# Patient Record
Sex: Female | Born: 1985 | Race: White | Hispanic: No | Marital: Married
Health system: Midwestern US, Community
[De-identification: ages and names within clinical notes are randomized; demographics above are authoritative.]

## PROBLEM LIST (undated history)

## (undated) DIAGNOSIS — L709 Acne, unspecified: Secondary | ICD-10-CM

## (undated) DIAGNOSIS — I10 Essential (primary) hypertension: Secondary | ICD-10-CM

## (undated) DIAGNOSIS — G43809 Other migraine, not intractable, without status migrainosus: Secondary | ICD-10-CM

## (undated) DIAGNOSIS — G43909 Migraine, unspecified, not intractable, without status migrainosus: Secondary | ICD-10-CM

## (undated) HISTORY — PX: CHOLECYSTECTOMY: SHX55

---

## 2016-09-26 DIAGNOSIS — Z6837 Body mass index (BMI) 37.0-37.9, adult: Secondary | ICD-10-CM | POA: Insufficient documentation

## 2016-09-26 DIAGNOSIS — Z8742 Personal history of other diseases of the female genital tract: Secondary | ICD-10-CM | POA: Insufficient documentation

## 2016-09-26 DIAGNOSIS — M549 Dorsalgia, unspecified: Secondary | ICD-10-CM | POA: Insufficient documentation

## 2016-09-26 DIAGNOSIS — Z9049 Acquired absence of other specified parts of digestive tract: Secondary | ICD-10-CM | POA: Insufficient documentation

## 2016-09-26 DIAGNOSIS — G8929 Other chronic pain: Secondary | ICD-10-CM | POA: Insufficient documentation

## 2017-03-29 DIAGNOSIS — J019 Acute sinusitis, unspecified: Secondary | ICD-10-CM | POA: Insufficient documentation

## 2017-06-27 DIAGNOSIS — R590 Localized enlarged lymph nodes: Secondary | ICD-10-CM | POA: Insufficient documentation

## 2020-06-04 ENCOUNTER — Telehealth: Attending: Family Medicine | Primary: Family Medicine

## 2020-06-04 ENCOUNTER — Telehealth
Admit: 2020-06-04 | Discharge: 2020-06-04 | Payer: BLUE CROSS/BLUE SHIELD | Attending: Family Medicine | Primary: Family Medicine

## 2020-06-04 DIAGNOSIS — I10 Essential (primary) hypertension: Secondary | ICD-10-CM

## 2020-06-04 NOTE — Assessment & Plan Note (Signed)
Blood pressure has been low normal lately, advised to cut lisinopril in half.  Continue to monitor blood pressure regularly.  If normal range we will continue to wean.  Will check basic lab work.

## 2020-06-04 NOTE — Assessment & Plan Note (Signed)
Overall seem to be well managed off of propranolol.  We will continue to monitor.
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
## 2020-09-06 ENCOUNTER — Encounter

## 2020-09-06 MED ORDER — PROPRANOLOL 80 MG TAB
80 mg | ORAL_TABLET | Freq: Every day | ORAL | 1 refills | Status: DC | PRN
Start: 2020-09-06 — End: 2020-09-24

## 2020-09-06 NOTE — Telephone Encounter (Signed)
Patient is requesting a Rx refill for propranolol 80 mg    [facility] - [address]

## 2020-09-06 NOTE — Telephone Encounter (Signed)
Approved.    Argie Ramming, NP

## 2020-09-24 MED ORDER — PROPRANOLOL 80 MG 24 HR SUSTAINED ACTION CAP
80 mg | ORAL_CAPSULE | Freq: Every day | ORAL | 1 refills | Status: DC
Start: 2020-09-24 — End: 2020-12-02

## 2020-09-24 NOTE — Telephone Encounter (Signed)
Changed rx to ER formulation and resent to pharmacy.  Please let pt know

## 2020-09-24 NOTE — Telephone Encounter (Signed)
-----   Message from Dallie Dad sent at 09/24/2020  8:03 AM EST -----  Subject: Message to Provider    QUESTIONS  Information for Provider? Patient states her propanol is supposed to be   extended release instead. Please call patient nd advise on sending correct   med to pharmacy.   ---------------------------------------------------------------------------  --------------  Cleotis Lema INFO  What is the best way for the office to contact you? OK to leave message on   voicemail  Preferred Call Back Phone Number? [phone]  ---------------------------------------------------------------------------  --------------  SCRIPT ANSWERS  Relationship to Patient? Self

## 2020-09-24 NOTE — Telephone Encounter (Signed)
Patient notified that her medication was changed to Propanolol ER and it was sent to the pharmacy by the provider.

## 2020-12-02 ENCOUNTER — Telehealth: Attending: Geriatric Medicine | Primary: Family Medicine

## 2020-12-02 ENCOUNTER — Telehealth
Admit: 2020-12-02 | Discharge: 2020-12-02 | Payer: PRIVATE HEALTH INSURANCE | Attending: Geriatric Medicine | Primary: Family Medicine

## 2020-12-02 DIAGNOSIS — J019 Acute sinusitis, unspecified: Secondary | ICD-10-CM

## 2020-12-02 MED ORDER — AMOXICILLIN CLAVULANATE 875 MG-125 MG TAB
875-125 mg | ORAL_TABLET | Freq: Two times a day (BID) | ORAL | 0 refills | Status: AC
Start: 2020-12-02 — End: ?

## 2020-12-02 MED ORDER — LISINOPRIL 10 MG TAB
10 mg | ORAL_TABLET | Freq: Every day | ORAL | 3 refills | Status: DC
Start: 2020-12-02 — End: 2020-12-31

## 2020-12-02 MED ORDER — PROPRANOLOL 80 MG 24 HR SUSTAINED ACTION CAP
80 mg | ORAL_CAPSULE | Freq: Every day | ORAL | 3 refills | Status: DC
Start: 2020-12-02 — End: 2020-12-31

## 2020-12-02 NOTE — Progress Notes (Signed)
Dominique Berry (DOB: 11-May-1986) is a 35 y.o. female, established patient, here for evaluation of the following chief complaint(s):   Sinus Pain       ASSESSMENT/PLAN:  1. Acute non-recurrent sinusitis, unspecified location  2. Seasonal allergic rhinitis due to pollen    Likely sinus infection, will treat with Augmentin course, continue Flonase, can use Allegra or Zyrtec or Claritin as well.  Refill patient's propranolol and lisinopril,  Patient to see office for physical in the next few months      SUBJECTIVE/OBJECTIVE:  Sinus Pain   The history is provided by the patient. This is a new problem. The current episode started more than 1 week ago. The problem has been gradually worsening. There has been no fever. Associated symptoms include congestion, sore throat and cough. She has tried decongestants and nasal steroids for the symptoms.       Review of Systems   HENT: Positive for congestion, sinus pain and sore throat.    Eyes: Negative.    Respiratory: Positive for cough.    Cardiovascular: Negative.    Gastrointestinal: Negative.    Genitourinary: Negative.    Musculoskeletal: Negative.    Neurological: Negative.    Psychiatric/Behavioral: Negative.         No flowsheet data found.    Physical Exam  Vitals reviewed.   Constitutional:       Appearance: Normal appearance.   HENT:      Head: Normocephalic.      Right Ear: External ear normal.      Left Ear: External ear normal.      Nose: Nose normal.      Mouth/Throat:      Mouth: Mucous membranes are moist.   Eyes:      Pupils: Pupils are equal, round, and reactive to light.   Pulmonary:      Effort: Pulmonary effort is normal.   Musculoskeletal:      Cervical back: Normal range of motion.   Skin:     Coloration: Skin is not jaundiced or pale.   Neurological:      General: No focal deficit present.      Mental Status: She is alert and oriented to person, place, and time.   Psychiatric:         Mood and Affect: Mood normal.         Behavior: Behavior normal.          Thought Content: Thought content normal.         Judgment: Judgment normal.                   Dimas Millin is being evaluated by a Virtual Visit (video visit) encounter to address concerns as mentioned above.  A caregiver was present when appropriate. Due to this being a Scientist, research (medical) (During COVID-19 public health emergency), evaluation of the following organ systems was limited: Vitals/Constitutional/EENT/Resp/CV/GI/GU/MS/Neuro/Skin/Heme-Lymph-Imm.  Pursuant to the emergency declaration under the [facility] Act and the [employer], 1135 waiver authority and the [employer] Act, this Virtual Visit was conducted with patient's (and/or legal guardian's) consent, to reduce the patient's risk of exposure to COVID-19 and provide necessary medical care.  The patient (and/or legal guardian) has also been advised to contact this office for worsening conditions or problems, and seek emergency medical treatment and/or call 911 if deemed necessary.    Patient identification was verified at the start of the visit: Yes    Services were provided  through a video synchronous discussion virtually to substitute for in-person clinic visit. Patient and provider were located at their individual homes.    An electronic signature was used to authenticate this note.  -- Frankey Shown, MD

## 2020-12-31 ENCOUNTER — Ambulatory Visit: Attending: Family Medicine | Primary: Family Medicine

## 2020-12-31 ENCOUNTER — Ambulatory Visit
Admit: 2020-12-31 | Discharge: 2020-12-31 | Payer: PRIVATE HEALTH INSURANCE | Attending: Family Medicine | Primary: Family Medicine

## 2020-12-31 DIAGNOSIS — Z0000 Encounter for general adult medical examination without abnormal findings: Secondary | ICD-10-CM

## 2020-12-31 MED ORDER — RIZATRIPTAN 10 MG TAB, RAPID DISSOLVE
10 mg | ORAL_TABLET | Freq: Once | ORAL | 5 refills | Status: AC | PRN
Start: 2020-12-31 — End: 2020-12-31

## 2020-12-31 MED ORDER — PROPRANOLOL 80 MG 24 HR SUSTAINED ACTION CAP
80 mg | ORAL_CAPSULE | Freq: Every day | ORAL | 3 refills | Status: AC
Start: 2020-12-31 — End: ?

## 2020-12-31 MED ORDER — LISINOPRIL 10 MG TAB
10 mg | ORAL_TABLET | Freq: Every day | ORAL | 3 refills | Status: AC
Start: 2020-12-31 — End: 2021-12-31

## 2020-12-31 NOTE — Progress Notes (Signed)
Dominique MillinHolly Berry (DOB: 06/14/1986) is a 35 y.o. female, new patient, here for evaluation of the following chief complaint(s):  Complete Physical (Grad School Paperwork)       ASSESSMENT/PLAN:  1. Physical exam  2. Migraine without status migrainosus, not intractable, unspecified migraine type  -     propranolol LA (INDERAL LA) 80 mg SR capsule; Take 1 Capsule by mouth daily., Normal, Disp-90 Capsule, R-3  -     rizatriptan (MAXALT-MLT) 10 mg disintegrating tablet; Take 1 Tablet by mouth once as needed for Migraine for up to 1 dose., Normal, Disp-30 Tablet, R-5  3. Hypertension, unspecified type  -     lisinopriL (PRINIVIL, ZESTRIL) 10 mg tablet; Take 1 Tablet by mouth daily., Normal, Disp-90 Tablet, R-3      Immunizations recorded will ensure they are up-to-date.  Up-to-date on Pap smear.  Physical exam done today, no abnormalities noted.  We will work on diet exercise as well as weight loss.  Vision corrected and hearing normal.    Patient is physically and emotionally healthy, I expect no limitations in her clinical studies.    No orders of the defined types were placed in this encounter.      SUBJECTIVE/OBJECTIVE:  HPI  35 year old female who presents for complete physical.  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  She is starting in the fall.    She has no acute complaints today, is going to work hard at losing weight, exercising and eating healthy.  Her goal is to get off her blood pressure and migraine medications.    PMH   has a past medical history of Migraines.    Past Surgical History:   Procedure Laterality Date   ??? HX CHOLECYSTECTOMY  2000       Current Outpatient Medications on File Prior to Visit   Medication Sig Dispense Refill   ??? EPINEPHrine (EPIPEN) 0.3 mg/0.3 mL injection 0.3 mg by IntraMUSCular route.     ??? fluticasone propionate (FLONASE) 50 mcg/actuation nasal spray 2 Sprays by Nasal route daily.     ??? therapeutic multivitamin (THERAGRAN) tablet Take 1 Tablet by  mouth daily.     ??? amoxicillin-clavulanate (AUGMENTIN) 875-125 mg per tablet Take 1 Tablet by mouth every twelve (12) hours. (Patient not taking: Reported on 12/31/2020) 20 Tablet 0   ??? [DISCONTINUED] lisinopriL (PRINIVIL, ZESTRIL) 10 mg tablet Take 1 Tablet by mouth daily. 90 Tablet 3   ??? [DISCONTINUED] propranolol LA (INDERAL LA) 80 mg SR capsule Take 1 Capsule by mouth daily. 90 Capsule 3   ??? norethindrone (MICRONOR) 0.35 mg tab Take 0.35 mg by mouth daily. (Patient not taking: Reported on 12/31/2020)     ??? [DISCONTINUED] rizatriptan (MAXALT-MLT) 10 mg disintegrating tablet 10 mg by SubLINGual route.       No current facility-administered medications on file prior to visit.       Social History     Socioeconomic History   ??? Marital status: MARRIED     Spouse name: Not on file   ??? Number of children: Not on file   ??? Years of education: Not on file   ??? Highest education level: Not on file   Occupational History   ??? Not on file   Tobacco Use   ??? Smoking status: Never Smoker   ??? Smokeless tobacco: Never Used   Vaping Use   ??? Vaping Use: Never used   Substance and Sexual Activity   ??? Alcohol  use: Yes     Comment: socially    ??? Drug use: Never   ??? Sexual activity: Yes     Partners: Male     Birth control/protection: Pill   Other Topics Concern   ??? Not on file   Social History Narrative   ??? Not on file     Social Determinants of Health     Financial Resource Strain:    ??? Difficulty of Paying Living Expenses: Not on file   Food Insecurity:    ??? Worried About Running Out of Food in the Last Year: Not on file   ??? Ran Out of Food in the Last Year: Not on file   Transportation Needs:    ??? Lack of Transportation (Medical): Not on file   ??? Lack of Transportation (Non-Medical): Not on file   Physical Activity:    ??? Days of Exercise per Week: Not on file   ??? Minutes of Exercise per Session: Not on file   Stress:    ??? Feeling of Stress : Not on file   Social Connections:    ??? Frequency of Communication with Friends and Family: Not  on file   ??? Frequency of Social Gatherings with Friends and Family: Not on file   ??? Attends Religious Services: Not on file   ??? Active Member of Clubs or Organizations: Not on file   ??? Attends Banker Meetings: Not on file   ??? Marital Status: Not on file   Intimate Partner Violence:    ??? Fear of Current or Ex-Partner: Not on file   ??? Emotionally Abused: Not on file   ??? Physically Abused: Not on file   ??? Sexually Abused: Not on file   Housing Stability:    ??? Unable to Pay for Housing in the Last Year: Not on file   ??? Number of Places Lived in the Last Year: Not on file   ??? Unstable Housing in the Last Year: Not on file       Family History   Adopted: Yes   Problem Relation Age of Onset   ??? Thyroid Disease Mother    ??? Diabetes Mother    ??? Cancer Mother         Colon Cancer and Breast Cancer   ??? Hypertension Maternal Grandmother    ??? Diabetes Paternal Grandmother    ??? Hypertension Paternal Grandmother    ??? Headache Paternal Grandmother    ??? Cancer Paternal Grandmother         Skin    ??? OSTEOARTHRITIS Half-sister    ??? Allergic Rhinitis Half-sister    ??? Migraines Half-sister          ALLERGIES  Allergies   Allergen Reactions   ??? Hymenoptera Allergenic Extract Anaphylaxis          ??? Stings [Sting, Bee] Anaphylaxis       PREVIOUS PRIMARY CARE PROVIDER  Terri Skains, MD      RECORDS    Provided by patient:      Review of Systems   Constitutional: Negative for activity change, appetite change, fever and unexpected weight change.   Respiratory: Negative for cough and shortness of breath.    Cardiovascular: Negative for chest pain.   Skin: Negative for rash.   Neurological: Negative for dizziness and headaches.   Psychiatric/Behavioral: Negative for sleep disturbance.       Visit Vitals  BP 130/78 (BP 1 Location: Left upper arm, BP Patient  Position: Sitting, BP Cuff Size: Adult)   Pulse 60   Temp 98.3 ??F (36.8 ??C) (Oral)   Resp 12   Ht 5\' 10"  (1.778 m)   Wt 263 lb (119.3 kg)   SpO2 98%   BMI 37.74 kg/m??        Physical Exam  Constitutional:       General: She is not in acute distress.     Appearance: Normal appearance. She is not ill-appearing, toxic-appearing or diaphoretic.   HENT:      Head: Normocephalic and atraumatic.      Right Ear: Tympanic membrane, ear canal and external ear normal. There is no impacted cerumen.      Left Ear: Tympanic membrane, ear canal and external ear normal. There is no impacted cerumen.      Nose: Nose normal. No congestion or rhinorrhea.      Mouth/Throat:      Mouth: Mucous membranes are moist.      Pharynx: Oropharynx is clear. No oropharyngeal exudate or posterior oropharyngeal erythema.   Eyes:      General: No scleral icterus.        Right eye: No discharge.         Left eye: No discharge.      Extraocular Movements: Extraocular movements intact.      Conjunctiva/sclera: Conjunctivae normal.      Pupils: Pupils are equal, round, and reactive to light.   Cardiovascular:      Rate and Rhythm: Normal rate and regular rhythm.      Heart sounds: Normal heart sounds. No murmur heard.  No friction rub. No gallop.    Pulmonary:      Effort: Pulmonary effort is normal. No respiratory distress.      Breath sounds: Normal breath sounds. No stridor. No wheezing, rhonchi or rales.   Abdominal:      General: Abdomen is flat. Bowel sounds are normal. There is no distension.      Palpations: Abdomen is soft. There is no mass.      Tenderness: There is no abdominal tenderness. There is no right CVA tenderness, left CVA tenderness, guarding or rebound.      Hernia: No hernia is present.   Musculoskeletal:         General: No swelling. Normal range of motion.      Cervical back: Normal range of motion. No rigidity or tenderness.   Lymphadenopathy:      Cervical: No cervical adenopathy.   Skin:     General: Skin is warm.      Findings: No rash.   Neurological:      Mental Status: She is alert.   Psychiatric:         Mood and Affect: Mood normal.         Behavior: Behavior normal.             No  LOS data to display            Orders Placed This Encounter   ??? lisinopriL (PRINIVIL, ZESTRIL) 10 mg tablet     Sig: Take 1 Tablet by mouth daily.     Dispense:  90 Tablet     Refill:  3   ??? propranolol LA (INDERAL LA) 80 mg SR capsule     Sig: Take 1 Capsule by mouth daily.     Dispense:  90 Capsule     Refill:  3   ??? rizatriptan (MAXALT-MLT) 10 mg disintegrating tablet  Sig: Take 1 Tablet by mouth once as needed for Migraine for up to 1 dose.     Dispense:  30 Tablet     Refill:  5        An electronic signature was used to authenticate this note.  Terri Skains, MD

## 2021-01-07 NOTE — Telephone Encounter (Signed)
-----   Message from Darla Lesches sent at 01/07/2021  9:05 AM EDT -----  Subject: Message to Provider    QUESTIONS  Information for Provider? Patient called stating she had physical   appointment 12/31/2020, physical forms that was completed needs updated   info attached to it  ---------------------------------------------------------------------------  --------------  CALL BACK INFO  What is the best way for the office to contact you? OK to leave message on   voicemail  Preferred Call Back Phone Number? [phone]  ---------------------------------------------------------------------------  --------------  SCRIPT ANSWERS  undefined

## 2021-01-07 NOTE — Telephone Encounter (Signed)
I thought it was all filled out? If there was a place missed please let me know.

## 2021-01-07 NOTE — Telephone Encounter (Signed)
I spoke to the patient through the [facility] and she stated at the bottom of the form, it asks about mental health.  This was the part that was not filled out.     Because this is not being filled out, it is not being excepted.     Do we still have this form?

## 2021-01-07 NOTE — Telephone Encounter (Signed)
Yes the very bottom where it asks if the patient is mentally capable of doing her job.

## 2021-01-11 NOTE — Telephone Encounter (Signed)
Called patient and left a message, pelase let me know when she calls back

## 2021-03-22 ENCOUNTER — Emergency Department (INDEPENDENT_AMBULATORY_CARE_PROVIDER_SITE_OTHER): Payer: Self-pay

## 2021-03-22 ENCOUNTER — Emergency Department (INDEPENDENT_AMBULATORY_CARE_PROVIDER_SITE_OTHER)
Admission: RE | Admit: 2021-03-22 | Discharge: 2021-03-22 | Disposition: A | Discharge status: Home / Self Care | Payer: Self-pay | Source: Ambulatory Visit

## 2021-03-22 ENCOUNTER — Other Ambulatory Visit: Payer: Self-pay

## 2021-03-22 VITALS — BP 111/71 | HR 70 | Temp 99.3°F | Resp 17 | Ht 69.0 in | Wt 230.0 lb

## 2021-03-22 DIAGNOSIS — S39012A Strain of muscle, fascia and tendon of lower back, initial encounter: Secondary | ICD-10-CM

## 2021-03-22 DIAGNOSIS — S161XXA Strain of muscle, fascia and tendon at neck level, initial encounter: Secondary | ICD-10-CM

## 2021-03-22 DIAGNOSIS — M542 Cervicalgia: Secondary | ICD-10-CM

## 2021-03-22 DIAGNOSIS — M25562 Pain in left knee: Secondary | ICD-10-CM

## 2021-03-22 DIAGNOSIS — M25519 Pain in unspecified shoulder: Secondary | ICD-10-CM

## 2021-03-22 MED ORDER — IBUPROFEN 400 MG PO TABS: 400.0000 mg | ORAL_TABLET | Freq: Four times a day (QID) | ORAL | 0 refills | Status: AC | PRN

## 2021-03-22 MED ORDER — BACLOFEN 10 MG PO TABS: 10.0000 mg | ORAL_TABLET | Freq: Three times a day (TID) | ORAL | 0 refills | Status: AC
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
## 2021-03-22 NOTE — Discharge Instructions (Addendum)
Advised/instructed patient take medications as directed.  Advised patient may take medication either daily for the next 5 to 7 days or as needed.  Advised/encouraged patient to increase daily water intake while taking these medications.

## 2021-04-06 ENCOUNTER — Telehealth: Admit: 2021-04-06 | Discharge: 2021-04-06 | Attending: Family Medicine | Primary: Family Medicine

## 2021-04-06 MED ORDER — TRETINOIN 0.025 % EX GEL
0.025 % | CUTANEOUS | 2 refills | Status: DC
Start: 2021-04-06 — End: 2021-04-08
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
## 2021-04-07 NOTE — Telephone Encounter (Signed)
Please call [facility] in [place] to clarify prescription

## 2021-04-07 NOTE — Telephone Encounter (Signed)
Spoke with [facility] - Clarification Complete

## 2021-04-08 MED ORDER — TRETINOIN 0.025 % EX GEL
0.025 % | CUTANEOUS | 2 refills | Status: DC
Start: 2021-04-08 — End: 2021-05-30

## 2021-04-08 NOTE — Addendum Note (Signed)
Addended by: Terri Skains on: 04/08/2021 12:03 PM     Modules accepted: Orders

## 2021-04-08 NOTE — Telephone Encounter (Signed)
Pharmacy can not fill prescription for tretinoin as written. Please send new rx and include # of days. Number of days for 45g tube is 90

## 2021-04-08 NOTE — Telephone Encounter (Signed)
Ok! resent

## 2021-05-30 ENCOUNTER — Telehealth: Admit: 2021-05-30 | Attending: Family Medicine | Primary: Family Medicine

## 2021-05-30 DIAGNOSIS — I10 Essential (primary) hypertension: Secondary | ICD-10-CM

## 2021-05-30 MED ORDER — TRETINOIN 0.025 % EX GEL
0.025 % | CUTANEOUS | 2 refills | Status: DC
Start: 2021-05-30 — End: 2021-06-17

## 2021-05-30 MED ORDER — BACLOFEN 10 MG PO TABS
10 MG | ORAL_TABLET | Freq: Every day | ORAL | 3 refills | Status: AC | PRN
Start: 2021-05-30 — End: ?
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
## 2021-06-10 ENCOUNTER — Telehealth

## 2021-06-10 NOTE — Telephone Encounter (Signed)
Coverm[employer]eds sent a PA request for the patient's Tretinoin 0.025% gel.  This as placed in Covermymeds.      Patient notified

## 2021-06-14 NOTE — Telephone Encounter (Signed)
The PA for the Tretinoin 0.025% gel was denied per [email or web address].

## 2021-06-15 NOTE — Telephone Encounter (Signed)
Please place an order for the Tretinoin 0.025% cream she will pay out of pocket for this.

## 2021-06-17 MED ORDER — TRETINOIN 0.025 % EX GEL
0.025 % | CUTANEOUS | 2 refills | Status: DC
Start: 2021-06-17 — End: 2021-07-06

## 2021-06-17 NOTE — Addendum Note (Signed)
Addended by: Terri Skains on: 06/17/2021 02:35 PM     Modules accepted: Orders

## 2021-07-05 NOTE — Telephone Encounter (Signed)
Tretinoin 0.025 Cream please send to [facility] in [place].     Med pended to [facility] in [place].

## 2021-07-05 NOTE — Telephone Encounter (Signed)
Patient would like Tretinoin Cream prescribed and refilled not the Gel, she knows of price difference and will pay out of pocket. Please send to her pharmacy [facility] in [place], [place]   thank you.Marland KitchenMarland Kitchen

## 2021-07-06 MED ORDER — TRETINOIN 0.025 % EX CREA
0.025 % | CUTANEOUS | 2 refills | Status: AC
Start: 2021-07-06 — End: ?

## 2021-12-07 ENCOUNTER — Emergency Department (INDEPENDENT_AMBULATORY_CARE_PROVIDER_SITE_OTHER)
Admission: RE | Admit: 2021-12-07 | Discharge: 2021-12-07 | Disposition: A | Discharge status: Home / Self Care | Payer: BC Managed Care – PPO | Source: Ambulatory Visit

## 2021-12-07 VITALS — BP 124/82 | HR 88 | Temp 99.3°F | Resp 18

## 2021-12-07 DIAGNOSIS — L0501 Pilonidal cyst with abscess: Secondary | ICD-10-CM | POA: Diagnosis not present

## 2021-12-07 DIAGNOSIS — J988 Other specified respiratory disorders: Secondary | ICD-10-CM

## 2021-12-07 DIAGNOSIS — B9789 Other viral agents as the cause of diseases classified elsewhere: Secondary | ICD-10-CM | POA: Diagnosis not present

## 2021-12-07 MED ORDER — SULFAMETHOXAZOLE-TRIMETHOPRIM 800-160 MG PO TABS: 1.0000 | ORAL_TABLET | Freq: Two times a day (BID) | ORAL | 0 refills | Status: AC

## 2021-12-07 NOTE — ED Notes (Signed)
Poor IV access- provider updated - warm pack applied to R AC fossa. Will attempt shortly ?

## 2021-12-07 NOTE — Discharge Instructions (Addendum)
Advised patient to take medication as directed with food to completion.  Encouraged patient increase daily water intake while taking these medications. Advised/encouraged patient to follow-up in 2 days for recheck or Friday, 12/09/2021. ?

## 2021-12-07 NOTE — ED Provider Notes (Signed)
?[facility] ? ? ? ?CSN: [id number] ?Arrival date & time: 12/07/21  1836 ? ? ?  ? ?History   ?Chief Complaint ?Chief Complaint  ?Patient presents with  ? Fever  ? Pilonidal cyst  ? ? ?HPI ?Courtney Rodriguez is a 36 y.o. female.  ? ?HPI 36 year old female presents with pilonidal cyst and fever for 4 days.  Patient is accompanied by her husband who reports states doubled in size since Saturday, red warm to touch having body aches and fever 100.1. ? ?History reviewed. No pertinent past medical history. ? ?Patient Active Problem List  ? Diagnosis Date Noted  ? Cervical lymphadenopathy 06/27/2017  ? Acute sinusitis 03/29/2017  ? BMI 37.0-37.9, adult 09/26/2016  ? Chronic back pain 09/26/2016  ? History of cholecystectomy 09/26/2016  ? History of ovarian cyst 09/26/2016  ? ? ?Past Surgical History:  ?Procedure Laterality Date  ? CHOLECYSTECTOMY    ? ? ?OB History   ?No obstetric history on file. ?  ? ? ? ?Home Medications   ? ?Prior to Admission medications   ?Medication Sig Start Date End Date Taking? Authorizing Provider  ?sulfamethoxazole-trimethoprim (BACTRIM DS) 800-160 MG tablet Take 1 tablet by mouth 2 (two) times daily for 10 days. 12/07/21 12/17/21 Yes Trevor Iha, FNP  ?albuterol (VENTOLIN HFA) 108 (90 Base) MCG/ACT inhaler Inhale into the lungs. 09/26/16   [provider]  ?baclofen (LIORESAL) 10 MG tablet Take 1 tablet (10 mg total) by mouth 3 (three) times daily. 03/22/21   Trevor Iha, FNP  ?EPINEPHrine 0.3 mg/0.3 mL IJ SOAJ injection Inject into the muscle.    [provider]  ?fluticasone (FLONASE) 50 MCG/ACT nasal spray Place into the nose. 12/30/19   [provider]  ?ibuprofen (ADVIL) 400 MG tablet Take 1 tablet (400 mg total) by mouth every 6 (six) hours as needed. 03/22/21   Trevor Iha, FNP  ?lisinopril (ZESTRIL) 10 MG tablet Take 1 tablet by mouth daily. 12/30/19 12/31/21  [provider]  ?Multiple Vitamins-Minerals (ONCOVITE) TABS Take 1 tablet by mouth daily.     [provider]  ?propranolol (INNOPRAN XL) 80 MG 24 hr capsule Take 1 capsule by mouth daily. 08/03/17   [provider]  ?rizatriptan (MAXALT-MLT) 10 MG disintegrating tablet Place under the tongue. 08/03/17   [provider]  ? ? ?Family History ?Family History  ?Adopted: Yes  ?Problem Relation Age of Onset  ? Hypertension Mother   ? Heart failure Mother   ? Diabetes Mother   ? ? ?Social History ?Social History  ? ?Tobacco Use  ? Smoking status: Never  ?  Passive exposure: Never  ? Smokeless tobacco: Never  ?Vaping Use  ? Vaping Use: Never used  ?Substance Use Topics  ? Alcohol use: Not Currently  ? Drug use: Never  ? ? ? ?Allergies   ?Bee venom and Wasp venom protein ? ? ?Review of Systems ?Review of Systems  ?Skin:   ?     Pilonidal cyst of gluteal cleft x5 days  ? ? ?Physical Exam ?Triage Vital Signs ?ED Triage Vitals  ?Enc Vitals Group  ?   BP   ?   Pulse   ?   Resp   ?   Temp   ?   Temp src   ?   SpO2   ?   Weight   ?   Height   ?   Head Circumference   ?   Peak Flow   ?   Pain Score   ?  Pain Loc   ?   Pain Edu?   ?   Excl. in [place]?   ? ?No data found. ? ?Updated Vital Signs ?BP 124/82 (BP Location: Right Arm)   Pulse 88   Temp 99.3 ?F (37.4 ?C) (Oral)   Resp 18   LMP  (LMP Unknown)   SpO2 99%  ? ? ?Physical Exam ?Vitals and nursing note reviewed.  ?Constitutional:   ?   Appearance: Normal appearance. She is obese.  ?HENT:  ?   Head: Normocephalic and atraumatic.  ?   Mouth/Throat:  ?   Mouth: Mucous membranes are moist.  ?   Pharynx: Oropharynx is clear.  ?Eyes:  ?   Extraocular Movements: Extraocular movements intact.  ?   Conjunctiva/sclera: Conjunctivae normal.  ?   Pupils: Pupils are equal, round, and reactive to light.  ?Cardiovascular:  ?   Rate and Rhythm: Normal rate and regular rhythm.  ?   Pulses: Normal pulses.  ?   Heart sounds: Normal heart sounds.  ?Pulmonary:  ?   Effort: Pulmonary effort is normal.  ?   Breath sounds: Normal breath sounds. No wheezing,  rhonchi or rales.  ?Musculoskeletal:  ?   Cervical back: Normal range of motion and neck supple.  ?Skin: ?   General: Skin is warm and dry.  ?   Comments: Gluteal cleft: Erythematous pilonidal cyst noted  ?Neurological:  ?   General: No focal deficit present.  ?   Mental Status: She is alert and oriented to person, place, and time.  ? ? ? ?UC Treatments / Results  ?Labs ?(all labs ordered are listed, but only abnormal results are displayed) ?Labs Reviewed - No data to display ? ?EKG ? ? ?Radiology ?No results found. ? ?Procedures ?Incision and Drainage ? ?Date/Time: 12/07/2021 7:39 PM ?Performed by: Trevor Iha, FNP ?Authorized by: Trevor Iha, FNP  ? ?Consent:  ?  Consent obtained:  Verbal ?  Consent given by:  Patient ?  Risks, benefits, and alternatives were discussed: yes   ?Universal protocol:  ?  Procedure explained and questions answered to patient or proxy's satisfaction: yes   ?Location:  ?  Type:  Abscess ?  Size:  3 cm ?  Location: Gluteal cleft. ?Pre-procedure details:  ?  Skin preparation:  Povidone-iodine ?Anesthesia:  ?  Anesthesia method:  Local infiltration ?  Local anesthetic:  Lidocaine 2% w/o epi ?Procedure type:  ?  Complexity:  Simple ?Procedure details:  ?  Ultrasound guidance: no   ?  Needle aspiration: no   ?  Incision types:  Single straight ?  Incision depth:  Dermal ?  Drainage:  Purulent and serosanguinous ?  Drainage amount:  Moderate ?  Packing materials:  None ?Post-procedure details:  ?  Procedure completion:  Tolerated well, no immediate complications (including critical care time) ? ?Medications Ordered in UC ?Medications - No data to display ? ?Initial Impression / Assessment and Plan / UC Course  ?I have reviewed the triage vital signs and the nursing notes. ? ?Pertinent labs & imaging results that were available during my care of the patient were reviewed by me and considered in my medical decision making (see chart for details). ? ?  ? ?MDM: 1.  Pilonidal cyst-Rx'd  Bactrim, advised patient to follow-up in 2 days for wound check. Advised patient to take medication as directed with food to completion.  Encouraged patient increase daily water intake while taking these medications.  Patient discharged home, hemodynamically stable. ?Final Clinical  Impressions(s) / UC Diagnoses  ? ?Final diagnoses:  ?Pilonidal cyst with abscess  ?Viral respiratory illness  ? ? ? ?Discharge Instructions   ? ?  ?Advised patient to take medication as directed with food to completion.  Encouraged patient increase daily water intake while taking these medications. Advised/encouraged patient to follow-up in 2 days for recheck or Friday, 12/09/2021. ? ? ? ? ?ED Prescriptions   ? ? Medication Sig Dispense Auth. Provider  ? sulfamethoxazole-trimethoprim (BACTRIM DS) 800-160 MG tablet Take 1 tablet by mouth 2 (two) times daily for 10 days. 20 tablet Trevor Ihaagan, Crist Kruszka, FNP  ? ?  ? ?PDMP not reviewed this encounter. ?  ?Trevor IhaRagan, Karcyn Menn, FNP ?12/07/21 1957 ? ?

## 2021-12-07 NOTE — ED Triage Notes (Addendum)
Pt c/o possible pilonidal cyst since Saturday evening. Started to notice some tenderness. Husband states its over double in size since. Red, warm to touch, having bodyaches and fever. 100.1 last night. Warm soaks prn. Ibuprofen prn. Last dose 330pm.  ?

## 2021-12-09 ENCOUNTER — Emergency Department (INDEPENDENT_AMBULATORY_CARE_PROVIDER_SITE_OTHER)
Admission: EM | Admit: 2021-12-09 | Discharge: 2021-12-09 | Disposition: A | Discharge status: Home / Self Care | Payer: BC Managed Care – PPO | Source: Home / Self Care | Attending: Family Medicine | Admitting: Family Medicine

## 2021-12-09 ENCOUNTER — Encounter: Payer: Self-pay | Admitting: Emergency Medicine

## 2021-12-09 DIAGNOSIS — Z5189 Encounter for other specified aftercare: Secondary | ICD-10-CM

## 2021-12-09 NOTE — Discharge Instructions (Addendum)
Finish antibiotic. ?Continue warm compresses, and possibly a warm bathtub soak once daily. ?Continue daily bandage until all drainage stops. ? ?If symptoms become significantly worse during the night or over the weekend, proceed to the local emergency room.  ?

## 2021-12-09 NOTE — ED Provider Notes (Signed)
?[facility] ? ? ? ?CSN: [id number] ?Arrival date & time: 12/09/21  [id number] ? ? ?  ? ?History   ?Chief Complaint ?Chief Complaint  ?Patient presents with  ? Wound Check  ? ? ?HPI ?Courtney Rodriguez is a 36 y.o. female.  ? ?Patient underwent I and D pilonidal cyst three days ago.  She feels much better:  minimal pain.  Fevers, chills, and sweats have resolved.  She is still having some drainage. ? ?The history is provided by the patient.  ? ?History reviewed. No pertinent past medical history. ? ?Patient Active Problem List  ? Diagnosis Date Noted  ? Cervical lymphadenopathy 06/27/2017  ? Acute sinusitis 03/29/2017  ? BMI 37.0-37.9, adult 09/26/2016  ? Chronic back pain 09/26/2016  ? History of cholecystectomy 09/26/2016  ? History of ovarian cyst 09/26/2016  ? ? ?Past Surgical History:  ?Procedure Laterality Date  ? CHOLECYSTECTOMY    ? ? ?OB History   ?No obstetric history on file. ?  ? ? ? ?Home Medications   ? ?Prior to Admission medications   ?Medication Sig Start Date End Date Taking? Authorizing Provider  ?albuterol (VENTOLIN HFA) 108 (90 Base) MCG/ACT inhaler Inhale into the lungs. 09/26/16   [provider]  ?baclofen (LIORESAL) 10 MG tablet Take 1 tablet (10 mg total) by mouth 3 (three) times daily. 03/22/21   Eliezer Lofts, FNP  ?EPINEPHrine 0.3 mg/0.3 mL IJ SOAJ injection Inject into the muscle.    [provider]  ?fluticasone (FLONASE) 50 MCG/ACT nasal spray Place into the nose. 12/30/19   [provider]  ?ibuprofen (ADVIL) 400 MG tablet Take 1 tablet (400 mg total) by mouth every 6 (six) hours as needed. 03/22/21   Eliezer Lofts, FNP  ?lisinopril (ZESTRIL) 10 MG tablet Take 1 tablet by mouth daily. 12/30/19 12/31/21  [provider]  ?Multiple Vitamins-Minerals (ONCOVITE) TABS Take 1 tablet by mouth daily.    [provider]  ?propranolol (INNOPRAN XL) 80 MG 24 hr capsule Take 1 capsule by mouth daily. 08/03/17   [provider]  ?rizatriptan  (MAXALT-MLT) 10 MG disintegrating tablet Place under the tongue. 08/03/17   [provider]  ?sulfamethoxazole-trimethoprim (BACTRIM DS) 800-160 MG tablet Take 1 tablet by mouth 2 (two) times daily for 10 days. 12/07/21 12/17/21  Eliezer Lofts, FNP  ? ? ?Family History ?Family History  ?Adopted: Yes  ?Problem Relation Age of Onset  ? Hypertension Mother   ? Heart failure Mother   ? Diabetes Mother   ? ? ?Social History ?Social History  ? ?Tobacco Use  ? Smoking status: Never  ?  Passive exposure: Never  ? Smokeless tobacco: Never  ?Vaping Use  ? Vaping Use: Never used  ?Substance Use Topics  ? Alcohol use: Not Currently  ? Drug use: Never  ? ? ? ?Allergies   ?Bee venom and Wasp venom protein ? ? ?Review of Systems ?Review of Systems  ?Constitutional: Negative.   ?Skin:  Positive for wound. Negative for color change.  ?Neurological:  Negative for headaches.  ? ? ?Physical Exam ?Triage Vital Signs ?ED Triage Vitals  ?Enc Vitals Group  ?   BP 12/09/21 0817 113/76  ?   Pulse Rate 12/09/21 0817 74  ?   Resp 12/09/21 0817 17  ?   Temp 12/09/21 0817 99.1 ?F (37.3 ?C)  ?   Temp Source 12/09/21 0817 Oral  ?   SpO2 12/09/21 0817 98 %  ?   Weight 12/09/21 0819 229 lb 15  oz (104.3 kg)  ?   Height --   ?   Head Circumference --   ?   Peak Flow --   ?   Pain Score 12/09/21 0819 2  ?   Pain Loc --   ?   Pain Edu? --   ?   Excl. in [place]? --   ? ?No data found. ? ?Updated Vital Signs ?BP 113/76 (BP Location: Right Arm)   Pulse 74   Temp 99.1 ?F (37.3 ?C) (Oral)   Resp 17   Wt 104.3 kg   LMP 11/18/2021 (Approximate)   SpO2 98%   BMI 33.96 kg/m?  ? ?Visual Acuity ?Right Eye Distance:   ?Left Eye Distance:   ?Bilateral Distance:   ? ?Right Eye Near:   ?Left Eye Near:    ?Bilateral Near:    ? ?Physical Exam ?Vitals and nursing note reviewed. Exam conducted with a chaperone present.  ?Constitutional:   ?   General: She is not in acute distress. ?   Appearance: She is not ill-appearing.  ?Cardiovascular:  ?   Rate and Rhythm:  Normal rate.  ?Pulmonary:  ?   Effort: Pulmonary effort is normal.  ?Skin: ? ?    ?   Comments: Pilonidal incision site has minimal surrounding erythema and tenderness to palpation.  No drainage present.  ?Neurological:  ?   Mental Status: She is alert.  ? ? ? ?UC Treatments / Results  ?Labs ?(all labs ordered are listed, but only abnormal results are displayed) ?Labs Reviewed - No data to display ? ?EKG ? ? ?Radiology ?No results found. ? ?Procedures ?Procedures (including critical care time) ? ?Medications Ordered in UC ?Medications - No data to display ? ?Initial Impression / Assessment and Plan / UC Course  ?I have reviewed the triage vital signs and the nursing notes. ? ?Pertinent labs & imaging results that were available during my care of the patient were reviewed by me and considered in my medical decision making (see chart for details). ? ?  ? ?Pilonidal cyst improving. ?Return as needed. ?Final Clinical Impressions(s) / UC Diagnoses  ? ?Final diagnoses:  ?Visit for wound check  ?Wound check, abscess  ? ? ? ?Discharge Instructions   ? ?  ?Finish antibiotic. ?Continue warm compresses, and possibly a warm bathtub soak once daily. ?Continue daily bandage until all drainage stops. ? ?If symptoms become significantly worse during the night or over the weekend, proceed to the local emergency room.  ? ? ? ?ED Prescriptions   ?None ?  ? ? ?  ?Kandra Nicolas, MD ?12/09/21 [phone] ? ?

## 2021-12-09 NOTE — ED Triage Notes (Signed)
Return visit for cyst removal - wound check  ?Denies fever ?

## 2022-01-04 ENCOUNTER — Telehealth

## 2022-01-04 MED ORDER — PROPRANOLOL HCL ER 80 MG PO CP24
80 MG | ORAL_CAPSULE | Freq: Every day | ORAL | 1 refills | Status: AC
Start: 2022-01-04 — End: ?

## 2022-01-04 MED ORDER — LISINOPRIL 10 MG PO TABS
10 MG | ORAL_TABLET | Freq: Every day | ORAL | 1 refills | Status: AC
Start: 2022-01-04 — End: 2024-01-06

## 2022-01-04 NOTE — Telephone Encounter (Signed)
VM left for patient to return call.

## 2022-01-04 NOTE — Telephone Encounter (Signed)
Patient called back and clarified that she does need two refills.    Propranolol    Lisinopril 10 mg. She went back to 10 ng due to school stress causing her BP to rise.

## 2022-01-04 NOTE — Telephone Encounter (Signed)
VM left for patient to inform of providers note.

## 2022-01-04 NOTE — Telephone Encounter (Signed)
Is she still taking lisinopril?

## 2022-01-04 NOTE — Telephone Encounter (Signed)
Will give 90 days with 1 refill. Will need to be seen once im back in 6 months

## 2022-01-04 NOTE — Telephone Encounter (Signed)
-----   Message from Allen Kell sent at 01/03/2022 12:45 PM EDT -----  Regarding: FW: Refill  Contact: [phone]    ----- Message -----  From: Dominique Berry  Sent: 01/03/2022  10:03 AM EDT  To: , #  Subject: Refill                                           Good morning Dr. Bonney Roussel.     Was wondering if I can get 90 day supply refills on my Rx for propranolol and lisinopril?     Thanks. Hope all is well!

## 2022-07-15 ENCOUNTER — Encounter

## 2022-07-17 MED ORDER — LISINOPRIL 10 MG PO TABS
10 MG | ORAL_TABLET | Freq: Every day | ORAL | 0 refills | Status: AC
Start: 2022-07-17 — End: ?
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
